# Patient Record
Sex: Male | Born: 1940 | Race: White | Hispanic: No | Marital: Single | Smoking: Former smoker
Health system: Southern US, Community
[De-identification: ages and names within clinical notes are randomized; demographics above are authoritative.]

## PROBLEM LIST (undated history)

## (undated) DIAGNOSIS — Z720 Tobacco use: Secondary | ICD-10-CM

---

## 1998-06-19 ENCOUNTER — Ambulatory Visit (HOSPITAL_COMMUNITY): Admission: RE | Admit: 1998-06-19 | Discharge: 1998-06-19 | Payer: Self-pay | Admitting: Internal Medicine

## 1998-06-19 ENCOUNTER — Encounter: Payer: Self-pay | Admitting: Internal Medicine

## 2001-10-15 ENCOUNTER — Encounter: Payer: Self-pay | Admitting: Internal Medicine

## 2001-10-15 ENCOUNTER — Encounter: Admission: RE | Admit: 2001-10-15 | Discharge: 2001-10-15 | Payer: Self-pay | Admitting: Internal Medicine

## 2006-04-24 ENCOUNTER — Encounter: Admission: RE | Admit: 2006-04-24 | Discharge: 2006-04-24 | Payer: Self-pay | Admitting: Internal Medicine

## 2007-07-13 IMAGING — CR DG CHEST 2V
2 series · 2 of 2 positions shown · non-contrast
Comparison: none

CLINICAL DATA: Cough, congestion, short of breath.  Smoking history.
CHEST - TWO VIEWS:
Two views of the chest show the lungs to be clear and slightly hyperaerated.  Mild peribronchial thickening is noted consistent with bronchitic change.  The heart is within normal limits in size.  No bony abnormality is seen.

[view not recorded (1 of 2)]
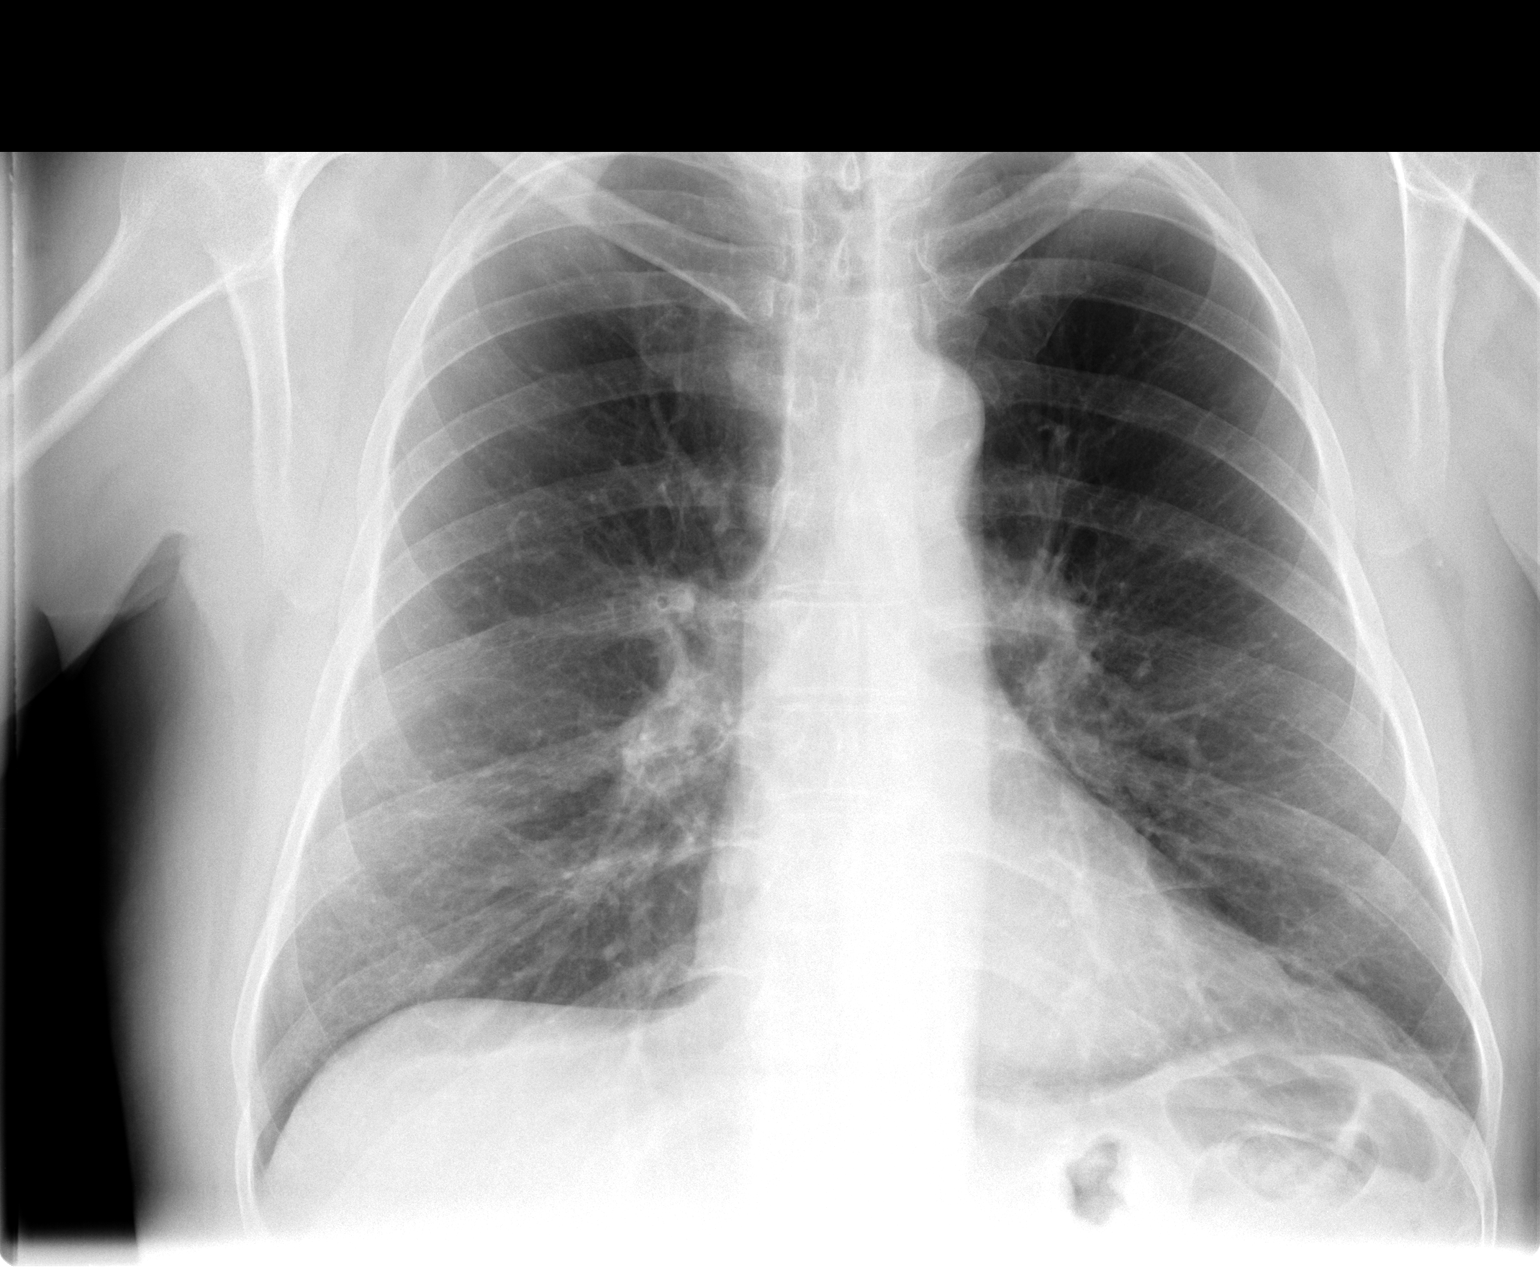

[view not recorded (2 of 2)]
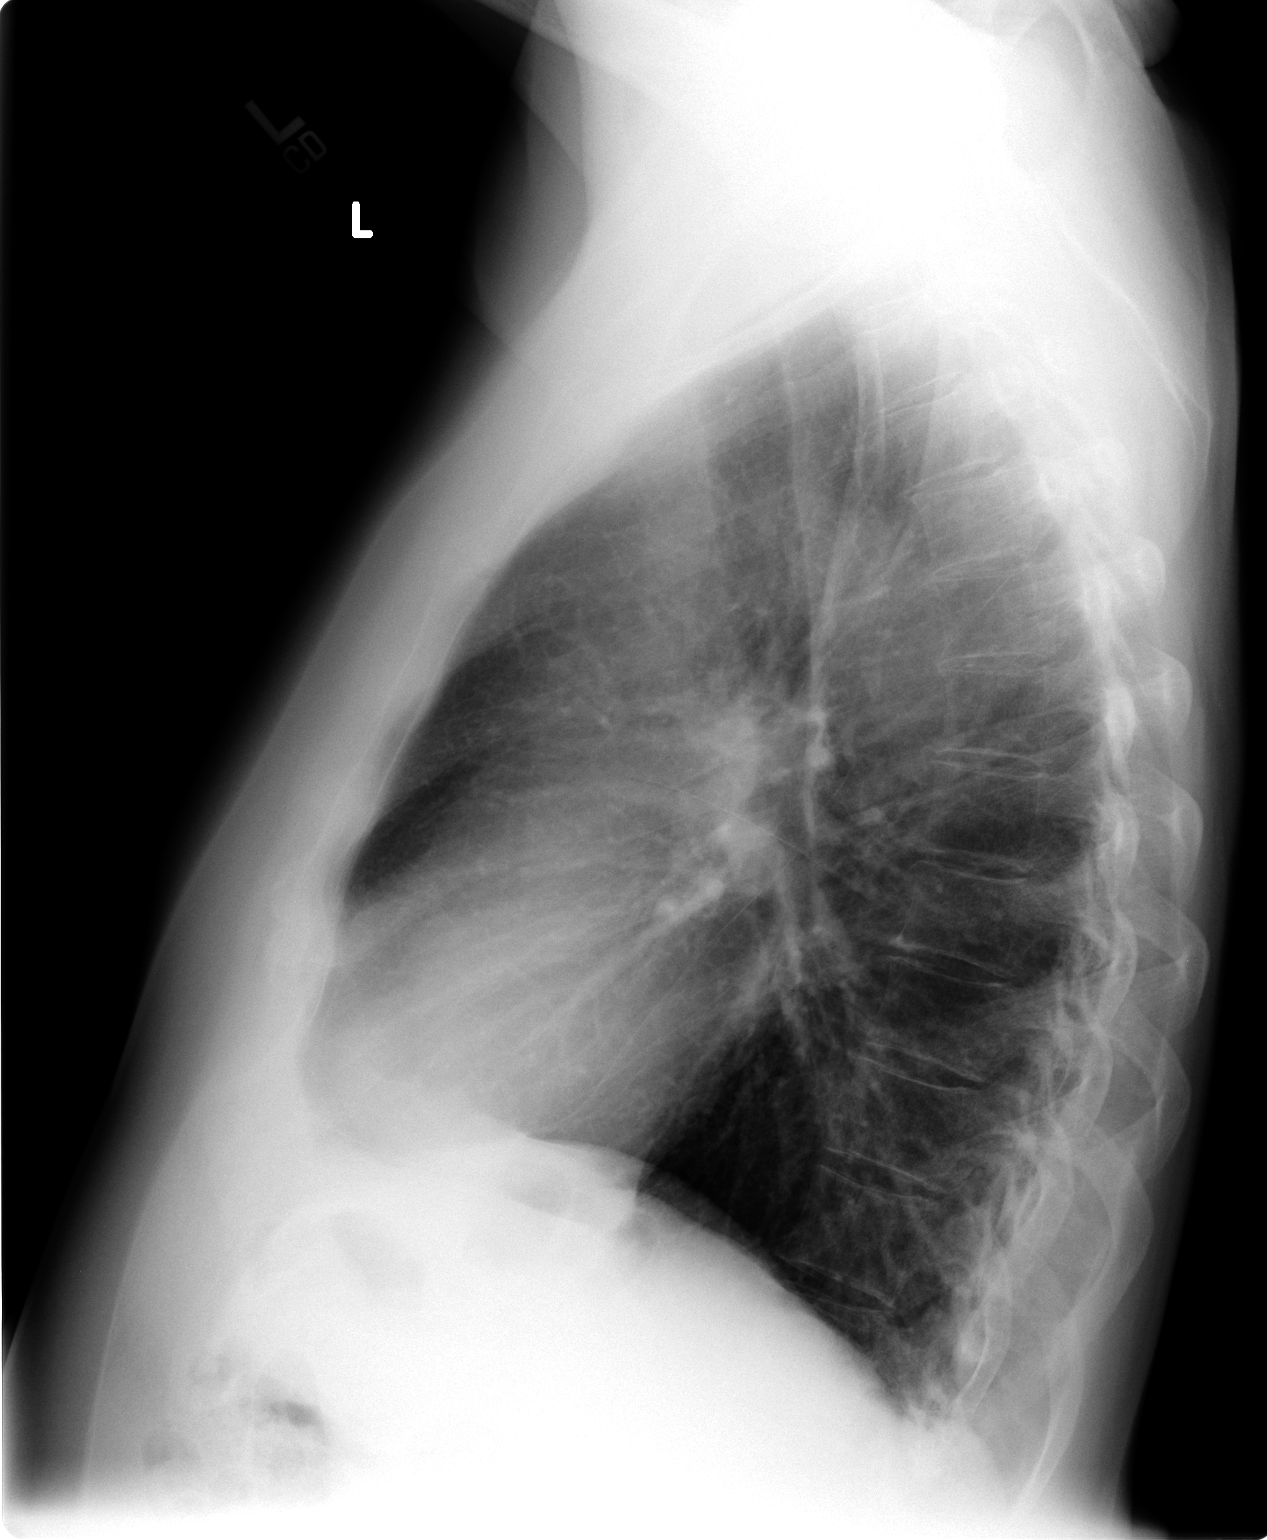

[2 of 2 positions shown; findings below may reference images not displayed]

IMPRESSION: Hyperaeration and bronchitic change.  No active infiltrate or effusion.

## 2021-09-10 ENCOUNTER — Other Ambulatory Visit: Payer: Self-pay

## 2021-09-10 ENCOUNTER — Inpatient Hospital Stay
Admission: EM | Admit: 2021-09-10 | Discharge: 2021-09-11 | DRG: 951 | Disposition: A | Discharge status: Hospice | Payer: Medicare PPO | Source: Skilled Nursing Facility | Attending: Internal Medicine | Admitting: Internal Medicine

## 2021-09-10 DIAGNOSIS — Z87891 Personal history of nicotine dependence: Secondary | ICD-10-CM | POA: Diagnosis not present

## 2021-09-10 DIAGNOSIS — Z20822 Contact with and (suspected) exposure to covid-19: Secondary | ICD-10-CM | POA: Diagnosis present

## 2021-09-10 DIAGNOSIS — Z515 Encounter for palliative care: Secondary | ICD-10-CM | POA: Diagnosis not present

## 2021-09-10 DIAGNOSIS — W19XXXA Unspecified fall, initial encounter: Secondary | ICD-10-CM | POA: Diagnosis present

## 2021-09-10 DIAGNOSIS — G4700 Insomnia, unspecified: Secondary | ICD-10-CM | POA: Diagnosis present

## 2021-09-10 DIAGNOSIS — Z5320 Procedure and treatment not carried out because of patient's decision for unspecified reasons: Secondary | ICD-10-CM | POA: Diagnosis not present

## 2021-09-10 DIAGNOSIS — W06XXXA Fall from bed, initial encounter: Secondary | ICD-10-CM | POA: Diagnosis not present

## 2021-09-10 DIAGNOSIS — Z28310 Unvaccinated for covid-19: Secondary | ICD-10-CM

## 2021-09-10 DIAGNOSIS — N184 Chronic kidney disease, stage 4 (severe): Secondary | ICD-10-CM | POA: Diagnosis present

## 2021-09-10 DIAGNOSIS — Y92230 Patient room in hospital as the place of occurrence of the external cause: Secondary | ICD-10-CM | POA: Diagnosis present

## 2021-09-10 DIAGNOSIS — R339 Retention of urine, unspecified: Secondary | ICD-10-CM | POA: Diagnosis present

## 2021-09-10 DIAGNOSIS — N3289 Other specified disorders of bladder: Secondary | ICD-10-CM | POA: Diagnosis present

## 2021-09-10 DIAGNOSIS — R627 Adult failure to thrive: Secondary | ICD-10-CM | POA: Diagnosis present

## 2021-09-10 DIAGNOSIS — R338 Other retention of urine: Secondary | ICD-10-CM | POA: Diagnosis present

## 2021-09-10 DIAGNOSIS — N179 Acute kidney failure, unspecified: Secondary | ICD-10-CM | POA: Diagnosis present

## 2021-09-10 DIAGNOSIS — Z66 Do not resuscitate: Secondary | ICD-10-CM | POA: Diagnosis present

## 2021-09-10 HISTORY — DX: Tobacco use: Z72.0

## 2021-09-10 LAB — URINALYSIS, ROUTINE W REFLEX MICROSCOPIC
Bilirubin Urine: NEGATIVE
Glucose, UA: 50 mg/dL — AB
Ketones, ur: NEGATIVE mg/dL
Leukocytes,Ua: NEGATIVE
Nitrite: NEGATIVE
Protein, ur: 100 mg/dL — AB
RBC / HPF: >50 RBC/hpf — ABNORMAL HIGH (ref 0–5)
Specific Gravity, Urine: 1.01 (ref 1.005–1.030)
Squamous Epithelial / HPF: NONE SEEN (ref 0–5)
pH: 8 (ref 5.0–8.0)

## 2021-09-10 LAB — CBC
HCT: 36.8 % — ABNORMAL LOW (ref 39.0–52.0)
Hemoglobin: 12 g/dL — ABNORMAL LOW (ref 13.0–17.0)
MCH: 28.9 pg (ref 26.0–34.0)
MCHC: 32.6 g/dL (ref 30.0–36.0)
MCV: 88.7 fL (ref 80.0–100.0)
Platelets: 226 10*3/uL (ref 150–400)
RBC: 4.15 MIL/uL — ABNORMAL LOW (ref 4.22–5.81)
RDW: 15.5 % (ref 11.5–15.5)
WBC: 8.4 10*3/uL (ref 4.0–10.5)
nRBC: 0 % (ref 0.0–0.2)

## 2021-09-10 LAB — BASIC METABOLIC PANEL
Anion gap: 15 (ref 5–15)
BUN: 80 mg/dL — ABNORMAL HIGH (ref 8–23)
CO2: 21 mmol/L — ABNORMAL LOW (ref 22–32)
Calcium: 8 mg/dL — ABNORMAL LOW (ref 8.9–10.3)
Chloride: 105 mmol/L (ref 98–111)
Creatinine, Ser: 4.33 mg/dL — ABNORMAL HIGH (ref 0.61–1.24)
GFR, Estimated: 13 mL/min — ABNORMAL LOW (ref >60)
Glucose, Bld: 113 mg/dL — ABNORMAL HIGH (ref 70–99)
Potassium: 3.4 mmol/L — ABNORMAL LOW (ref 3.5–5.1)
Sodium: 141 mmol/L (ref 135–145)

## 2021-09-10 LAB — RESP PANEL BY RT-PCR (FLU A&B, COVID) ARPGX2
Influenza A by PCR: NEGATIVE
Influenza B by PCR: NEGATIVE
SARS Coronavirus 2 by RT PCR: NEGATIVE

## 2021-09-10 MED ORDER — ONDANSETRON HCL 4 MG PO TABS: 4.0000 mg | ORAL_TABLET | Freq: Four times a day (QID) | ORAL | Status: DC | PRN

## 2021-09-10 MED ORDER — TEMAZEPAM 15 MG PO CAPS
15.0000 mg | ORAL_CAPSULE | Freq: Every day | ORAL | Status: DC
  Administered 2021-09-11: 15 mg via ORAL
  Filled 2021-09-10: qty 1

## 2021-09-10 MED ORDER — ACETAMINOPHEN 325 MG PO TABS: 650.0000 mg | ORAL_TABLET | ORAL | Status: DC | PRN

## 2021-09-10 MED ORDER — CEFTRIAXONE SODIUM 2 G IJ SOLR
2.0000 g | Freq: Once | INTRAMUSCULAR | Status: DC
Start: 2021-09-10 — End: 2021-09-11
  Filled 2021-09-10: qty 20

## 2021-09-10 MED ORDER — ACETAMINOPHEN 325 MG RE SUPP: 650.0000 mg | Freq: Four times a day (QID) | RECTAL | Status: DC | PRN

## 2021-09-10 MED ORDER — LIDOCAINE HCL URETHRAL/MUCOSAL 2 % EX GEL
1.0000 | Freq: Once | CUTANEOUS | Status: AC
Start: 2021-09-10 — End: 2021-09-10
  Administered 2021-09-10: 1 via URETHRAL
  Filled 2021-09-10: qty 10

## 2021-09-10 MED ORDER — ONDANSETRON HCL 4 MG/2ML IJ SOLN: 4.0000 mg | Freq: Four times a day (QID) | INTRAMUSCULAR | Status: DC | PRN

## 2021-09-10 MED ORDER — CEPHALEXIN 500 MG PO CAPS: 500.0000 mg | ORAL_CAPSULE | Freq: Two times a day (BID) | ORAL | Status: DC

## 2021-09-10 MED ORDER — SODIUM CHLORIDE 0.9 % IV BOLUS: 1000.0000 mL | Freq: Once | INTRAVENOUS | Status: DC

## 2021-09-10 MED ORDER — ACETAMINOPHEN 325 MG PO TABS: 650.0000 mg | ORAL_TABLET | Freq: Four times a day (QID) | ORAL | Status: DC | PRN

## 2021-09-10 NOTE — ED Notes (Signed)
Bladder scan performed, >723ml observed on scan. Dr. Ellender Hose as well as Urology notified and aware. ?

## 2021-09-10 NOTE — Progress Notes (Signed)
? ?  UROLOGY NOTE ? ?I was consulted by Dr. Ellender Hose for Foley catheter placement, after RN and MD unable to place Foley. ? ?81 year old male who was reportedly recently hospitalized at [facility] records available) and a Foley catheter was placed at that time.  This apparently was traumatically accidentally removed at his nursing facility last week, and he has had difficulty voiding and gross hematuria since then.  He was brought to the ER by his daughter earlier today, but she is no longer here.  He is extremely frail and they are reportedly moving towards hospice care.  Bladder scan greater than 750 mL, bladder easily palpable on exam.  Creatinine elevated at 4.33, he was able to give some urine earlier today with greater than 50 RBCs, rare bacteria 20-50 WBCs. ? ?I assessed the patient at bedside this evening, and recommended Foley catheter placement in the setting of his significantly elevated bladder scan and renal failure, as well as blood at the meatus on exam.  He adamantly refused any further Foley attempts.  We discussed risks including renal failure, sepsis, bladder rupture, and death.  ? ?I returned to the room with Dr. Ellender Hose, and we both encouraged him to consider Foley catheter placement, and the patient again refused. ? ?We will reassess in a.m. and re-offer a Foley catheter attempt ? ?Gabriel Madrid, MD ?09/10/2021 ? ? ? ? ? ? ? ? ?

## 2021-09-10 NOTE — ED Provider Notes (Signed)
? ?[facility] ?Provider Note ? ? ? Event Date/Time  ? First MD Initiated Contact with Patient 09/10/21 1818   ?  (approximate) ? ? ?History  ? ?Foley Catheter Placement and Failure To Thrive ? ? ?HPI ? ?Gabriel Santiago is a 81 y.o. male with past medical history of CKD after recent admission for what sounds to be urinary retention, recently gone from living at home to a skilled nursing facility within the last month, here with bleeding from his urethra.  The patient reportedly had a Foley catheter placed during his recent admission at [facility].  He left with one in place.  He reportedly tried to walk very quickly from his bed at the skilled nursing facility last week, causing it to pull out with the balloon.  There was a moderate mount of bleeding.  They have since tried to replace the Foley with return of blood.  Since then, patient has been complaining of some increasing lower abdominal pain.  He has not been eating and drinking but this has been an ongoing issue.  Family is interested in potentially discussing hospice at home.  He got more uncomfortable today so they decided to bring him to the ER for further evaluation.  He currently denies any complaints to me. ?  ? ? ?Physical Exam  ? ?Triage Vital Signs: ?ED Triage Vitals [09/10/21 1654]  ?Enc Vitals Group  ?   BP (!) 151/90  ?   Pulse Rate 88  ?   Resp 17  ?   Temp (!) 97.5 ?F (36.4 ?C)  ?   Temp Source Oral  ?   SpO2 100 %  ?   Weight 100 lb (45.4 kg)  ?   Height 5\' 8"  (1.727 m)  ?   Head Circumference   ?   Peak Flow   ?   Pain Score   ?   Pain Loc   ?   Pain Edu?   ?   Excl. in [place]?   ? ? ?Most recent vital signs: ?Vitals:  ? 09/10/21 2300 09/10/21 2330  ?BP: (!) 143/83 (!) 144/88  ?Pulse: 82 85  ?Resp: 16   ?Temp:    ?SpO2: 100% 100%  ? ? ? ?General: Awake, no distress.  Cachectic. ?CV:  Good peripheral perfusion.  ?Resp:  Normal effort.  ?Abd:  No distention.  Firm, palpable bladder distention in the lower  abdomen. ?Other:  Small amount of blood from the urethral meatus ? ? ?ED Results / Procedures / Treatments  ? ?Labs ?(all labs ordered are listed, but only abnormal results are displayed) ?Labs Reviewed  ?BASIC METABOLIC PANEL - Abnormal; Notable for the following components:  ?    Result Value  ? Potassium 3.4 (*)   ? CO2 21 (*)   ? Glucose, Bld 113 (*)   ? BUN 80 (*)   ? Creatinine, Ser 4.33 (*)   ? Calcium 8.0 (*)   ? GFR, Estimated 13 (*)   ? All other components within normal limits  ?CBC - Abnormal; Notable for the following components:  ? RBC 4.15 (*)   ? Hemoglobin 12.0 (*)   ? HCT 36.8 (*)   ? All other components within normal limits  ?URINALYSIS, ROUTINE W REFLEX MICROSCOPIC - Abnormal; Notable for the following components:  ? Color, Urine RED (*)   ? APPearance CLOUDY (*)   ? Glucose, UA 50 (*)   ? Hgb urine dipstick LARGE (*)   ?  Protein, ur 100 (*)   ? RBC / HPF >50 (*)   ? Bacteria, UA RARE (*)   ? All other components within normal limits  ?RESP PANEL BY RT-PCR (FLU A&B, COVID) ARPGX2  ?BASIC METABOLIC PANEL  ?CBC  ?CBG MONITORING, ED  ? ? ? ?EKG ? ? ? ?RADIOLOGY ? ? ? ?I also independently reviewed and agree with radiologist interpretations. ? ? ?PROCEDURES: ? ?Critical Care performed: No ? ?BLADDER CATHETERIZATION ? ?Date/Time: 09/10/2021 11:45 PM ?Performed by: Duffy Bruce, MD ?Authorized by: Duffy Bruce, MD  ? ?Consent:  ?  Consent obtained:  Verbal ?  Consent given by:  Parent ?  Risks, benefits, and alternatives were discussed: yes   ?  Risks discussed:  False passage, incomplete procedure, urethral injury, infection and pain ?  Alternatives discussed:  Referral ?Anesthesia:  ?  Anesthesia method:  Topical application ?  Topical anesthetic:  Lidocaine gel ?Procedure details:  ?  Provider performed due to:  Complicated insertion ?  Catheter insertion:  Indwelling ?  Catheter size:  18 Fr ?  Number of attempts:  2 ?  Urine characteristics:  Bloody ?Post-procedure details:  ?  Procedure  completion:  Tolerated well, no immediate complications ? ? ? ?MEDICATIONS ORDERED IN ED: ?Medications  ?sodium chloride 0.9 % bolus 1,000 mL (0 mLs Intravenous Hold 09/10/21 2032)  ?cefTRIAXone (ROCEPHIN) 2 g in sodium chloride 0.9 % 100 mL IVPB (0 g Intravenous Hold 09/10/21 2000)  ?temazepam (RESTORIL) capsule 15 mg (has no administration in time range)  ?cephALEXin (KEFLEX) capsule 500 mg (has no administration in time range)  ?acetaminophen (TYLENOL) tablet 650 mg (has no administration in time range)  ?  Or  ?acetaminophen (TYLENOL) suppository 650 mg (has no administration in time range)  ?ondansetron (ZOFRAN) tablet 4 mg (has no administration in time range)  ?  Or  ?ondansetron (ZOFRAN) injection 4 mg (has no administration in time range)  ?lidocaine (XYLOCAINE) 2 % jelly 1 application. (1 application. Urethral Given 09/10/21 2000)  ? ? ? ?IMPRESSION / MDM / ASSESSMENT AND PLAN / ED COURSE  ?I reviewed the triage vital signs and the nursing notes. ?             ?               ? ? ?The patient is on the cardiac monitor to evaluate for evidence of arrhythmia and/or significant heart rate changes. ? ? ?MDM: 81 year old male with past medical history of what sounds like significant recent functional decline and weight loss, including recent admission to [facility] for acute urinary retention with renal failure.  Patient has recently gone to a skilled nursing facility, and reportedly took out his Foley catheter a week ago.  Here, the patient does have significant bladder distention.  He does not appear uncomfortable, however.  Discussed plan of care with the patient's daughter.  She would not like IV placement, or lab work.  However, she would like Foley catheter placement for comfort.  Patient is in agreement at this time.  Attempted to place Foley catheter, but unable to do so due to what appears to be prostate enlargement.  Patient had a small amount of blood at the urethral meatus.  No active bleeding  during placement or after.  I called urology, who came to the bedside.  Upon arrival of urology, patient no longer would like Foley catheter placement.  He is awake and alert.  He understands that without this, he could  have increasing pain as well as worsening renal failure.  Based on my discussion with him as well as patient's daughter, he would like to move towards hospice care, ideally at home.  We will plan to admit for transition of care team consultation and hospice evaluation.  I have told him to let us know if he changes mind regarding the catheter.  Called daughter to update. ? ? ? ?MEDICATIONS GIVEN IN ED: ?Medications  ?sodium chloride 0.9 % bolus 1,000 mL (0 mLs Intravenous Hold 09/10/21 2032)  ?cefTRIAXone (ROCEPHIN) 2 g in sodium chloride 0.9 % 100 mL IVPB (0 g Intravenous Hold 09/10/21 2000)  ?temazepam (RESTORIL) capsule 15 mg (has no administration in time range)  ?cephALEXin (KEFLEX) capsule 500 mg (has no administration in time range)  ?acetaminophen (TYLENOL) tablet 650 mg (has no administration in time range)  ?  Or  ?acetaminophen (TYLENOL) suppository 650 mg (has no administration in time range)  ?ondansetron (ZOFRAN) tablet 4 mg (has no administration in time range)  ?  Or  ?ondansetron (ZOFRAN) injection 4 mg (has no administration in time range)  ?lidocaine (XYLOCAINE) 2 % jelly 1 application. (1 application. Urethral Given 09/10/21 2000)  ? ? ? ?Consults:  ?Urology Dr. Diamantina Providence ? ? ?EMR reviewed  ? ? ? ? ? ?FINAL CLINICAL IMPRESSION(S) / ED DIAGNOSES  ? ?Final diagnoses:  ?Urinary retention  ? ? ? ?Rx / DC Orders  ? ?ED Discharge Orders   ? ? None  ? ?  ? ? ? ?Note:  This document was prepared using Dragon voice recognition software and may include unintentional dictation errors. ?  ?Duffy Bruce, MD ?09/10/21 2348 ? ?

## 2021-09-10 NOTE — ED Notes (Signed)
Urology cart at the bedside, Uro[employer] given to Dr. Ellender Hose ?

## 2021-09-10 NOTE — ED Notes (Signed)
Report received from Tom, RN.

## 2021-09-10 NOTE — Assessment & Plan Note (Signed)
-   Declined Foley catheter with ED provider and with urologist at bedside ?

## 2021-09-10 NOTE — ED Notes (Signed)
Dr. Ellender Hose attempted once to place a coude catheter without success and a second time with a silicone catheter without success. A call in to Urology is made. Pt tolerated procedure well. ?

## 2021-09-10 NOTE — ED Notes (Signed)
Secretary placed a request to equipment dept for Urology Cart. ?

## 2021-09-10 NOTE — ED Triage Notes (Signed)
Pt comes into the Ed via EMS from [facility], daughter who was there today, states the pt got up to go to the BR while attached to a foley catheter and it was ripped out, states he has been passing blood and today they attempted to reinsert the catheter without success. Pt has a hx of dementia and family concerns about FFT ? ?154/93 ?100%RA ?HR85 ?98.2 temp ?CBG 108 ?

## 2021-09-10 NOTE — ED Provider Triage Note (Signed)
Emergency Medicine Provider Triage Evaluation Note ? ?Prudy Feeler, a 81 y.o. male  was evaluated in triage.  Pt complains of urinary retention, hematuria, and CKD.  Patient presents with his adult daughter from [facility] after she found him to be unkept and unattended to in his room. ? ?Review of Systems  ?Positive: Dysuria, hematuria ?Negative: NVD ? ?Physical Exam  ?There were no vitals taken for this visit. ?Gen:   Awake, no distress  frail, cachetic ?Resp:  Normal effort CTA ?MSK:   Moves extremities without difficulty  ?Other:   ? ?Medical Decision Making  ?Medically screening exam initiated at 3:29 PM.  Appropriate orders placed.  TREVONNE NYLAND was informed that the remainder of the evaluation will be completed by another provider, this initial triage assessment does not replace that evaluation, and the importance of remaining in the ED until their evaluation is complete. ? ?Geriatric patient to the ED with family for concerns for possible urinary retention and CKD.  Patient family is also requesting help with hospice management and home health care. ?  ?Melvenia Needles, PA-C ?09/10/21 1533 ? ?

## 2021-09-11 ENCOUNTER — Encounter: Payer: Self-pay | Admitting: Internal Medicine

## 2021-09-11 DIAGNOSIS — Z515 Encounter for palliative care: Secondary | ICD-10-CM | POA: Diagnosis not present

## 2021-09-11 DIAGNOSIS — N184 Chronic kidney disease, stage 4 (severe): Secondary | ICD-10-CM | POA: Diagnosis present

## 2021-09-11 DIAGNOSIS — W19XXXA Unspecified fall, initial encounter: Secondary | ICD-10-CM | POA: Diagnosis present

## 2021-09-11 MED ORDER — MORPHINE SULFATE (CONCENTRATE) 10 MG/0.5ML PO SOLN: 5.0000 mg | ORAL | 0 refills | Status: AC | PRN

## 2021-09-11 MED ORDER — MORPHINE SULFATE (CONCENTRATE) 10 MG/0.5ML PO SOLN: 5.0000 mg | ORAL | Status: DC | PRN

## 2021-09-11 MED ORDER — HALOPERIDOL LACTATE 5 MG/ML IJ SOLN: 0.5000 mg | INTRAMUSCULAR | Status: DC | PRN

## 2021-09-11 MED ORDER — ONDANSETRON 4 MG PO TBDP
4.0000 mg | ORAL_TABLET | Freq: Four times a day (QID) | ORAL | 0 refills | Status: AC | PRN
Start: 2021-09-11 — End: ?

## 2021-09-11 MED ORDER — GLYCOPYRROLATE 1 MG PO TABS
1.0000 mg | ORAL_TABLET | ORAL | Status: DC | PRN
  Filled 2021-09-11: qty 1

## 2021-09-11 MED ORDER — ACETAMINOPHEN 325 MG PO TABS: 650.0000 mg | ORAL_TABLET | Freq: Four times a day (QID) | ORAL | Status: AC | PRN

## 2021-09-11 MED ORDER — HALOPERIDOL 0.5 MG PO TABS
0.5000 mg | ORAL_TABLET | ORAL | Status: AC | PRN
Start: 2021-09-11 — End: ?

## 2021-09-11 MED ORDER — ONDANSETRON 4 MG PO TBDP
4.0000 mg | ORAL_TABLET | Freq: Four times a day (QID) | ORAL | Status: DC | PRN
Start: 2021-09-11 — End: 2021-09-11
  Administered 2021-09-11: 4 mg via ORAL
  Filled 2021-09-11: qty 1

## 2021-09-11 MED ORDER — HALOPERIDOL LACTATE 2 MG/ML PO CONC
0.5000 mg | ORAL | Status: DC | PRN
  Filled 2021-09-11: qty 5

## 2021-09-11 MED ORDER — GLYCOPYRROLATE 0.2 MG/ML IJ SOLN
0.2000 mg | INTRAMUSCULAR | Status: DC | PRN
  Filled 2021-09-11: qty 1

## 2021-09-11 MED ORDER — HALOPERIDOL 0.5 MG PO TABS
0.5000 mg | ORAL_TABLET | ORAL | Status: DC | PRN
  Filled 2021-09-11: qty 1

## 2021-09-11 MED ORDER — LORAZEPAM 2 MG/ML IJ SOLN: 1.0000 mg | INTRAMUSCULAR | Status: DC | PRN

## 2021-09-11 MED ORDER — BIOTENE DRY MOUTH MT LIQD
15.0000 mL | OROMUCOSAL | Status: DC | PRN
  Filled 2021-09-11: qty 15

## 2021-09-11 MED ORDER — LORAZEPAM 1 MG PO TABS: 1.0000 mg | ORAL_TABLET | ORAL | Status: DC | PRN

## 2021-09-11 MED ORDER — ACETAMINOPHEN 325 MG RE SUPP: 650.0000 mg | Freq: Four times a day (QID) | RECTAL | Status: DC | PRN

## 2021-09-11 MED ORDER — ACETAMINOPHEN 325 MG PO TABS: 650.0000 mg | ORAL_TABLET | Freq: Four times a day (QID) | ORAL | Status: DC | PRN

## 2021-09-11 MED ORDER — MORPHINE SULFATE (PF) 2 MG/ML IV SOLN: 1.0000 mg | INTRAVENOUS | Status: DC | PRN

## 2021-09-11 MED ORDER — LORAZEPAM 1 MG PO TABS
1.0000 mg | ORAL_TABLET | ORAL | 0 refills | Status: AC | PRN
Start: 2021-09-11 — End: ?

## 2021-09-11 MED ORDER — LORAZEPAM 2 MG/ML PO CONC: 1.0000 mg | ORAL | Status: DC | PRN

## 2021-09-11 MED ORDER — BIOTENE DRY MOUTH MT LIQD: 15.0000 mL | OROMUCOSAL | Status: AC | PRN

## 2021-09-11 MED ORDER — POLYVINYL ALCOHOL 1.4 % OP SOLN: 1.0000 [drp] | Freq: Four times a day (QID) | OPHTHALMIC | Status: DC | PRN

## 2021-09-11 MED ORDER — ONDANSETRON HCL 4 MG/2ML IJ SOLN
4.0000 mg | Freq: Four times a day (QID) | INTRAMUSCULAR | Status: DC | PRN
  Filled 2021-09-11: qty 2

## 2021-09-11 MED ORDER — GLYCOPYRROLATE 1 MG PO TABS
1.0000 mg | ORAL_TABLET | ORAL | Status: AC | PRN
Start: 2021-09-11 — End: ?

## 2021-09-11 MED ORDER — POLYVINYL ALCOHOL 1.4 % OP SOLN: 1.0000 [drp] | Freq: Four times a day (QID) | OPHTHALMIC | 0 refills | Status: AC | PRN

## 2021-09-11 NOTE — Assessment & Plan Note (Addendum)
-   Patient presenting with apparent failure to thrive with acute kidney injury presumed secondary to urinary retention ?- After discussion in the emergency medicine provider, family has decided to proceed with comfort care only ?- Comfort care order set utilized ?- No antibiotics or IVF as per family's request ?- As needed medications include pain control with morphine as needed, would transition to a drip if needed but patient does not appear to be in pain at this time ?- TOC consulted for outpatient versus inpatient hospice and to assist family with getting hospital bed and supplies etc. ?- RN can pronounce death ?

## 2021-09-11 NOTE — Progress Notes (Signed)
Report give to [facility]. Medical Necessity completed.  ?

## 2021-09-11 NOTE — Progress Notes (Addendum)
Manufacturing engineer [facility]) Hospital Liaison Note ? ?Received request from Transitions of Care Manager  Maryan Puls., RN for family interest in [facility]. Visited patient at bedside and spoke with daughter/Amanda to confirm interest and explain services. ? ?Approval for Hospice Home is determined by [facility] MD. Once [facility] MD has determined Hospice Home eligibility, [facility] will update hospital staff and family. Eligibility is approved. ? ?Addendum: ?Consent forms have been completed. ? ?EMS notified of patient D/C and transport arranged for 5p pickup, TOC/Jeanna and Attending Physician/Dr. Arbutus Ped also notified of transport arrangement.  ?  ?Please send signed DNR form with patient and RN call report to [phone].  ?  ?Daphene Calamity, MSW ?[facility] Liaison ?[phone] ? ? ?Please do not hesitate to call with any hospice related questions.  ?  ?Thank you for the opportunity to participate in this patient's care. ? ?Daphene Calamity, MSW ?[facility]  ?[phone] ? ?

## 2021-09-11 NOTE — Discharge Summary (Signed)
?Physician Discharge Summary ?  ?Patient: Gabriel Santiago MRN: [id number] DOB: 1940-06-22  ?Admit date:     09/10/2021  ?Discharge date: 09/11/21  ?Discharge Physician: Ezekiel Slocumb  ? ?PCP: Pcp, No  ? ?Recommendations at discharge:  ? ? Follow up with hospice providers ? ?Discharge Diagnoses: ?Principal Problem: ?  End of life care ?Active Problems: ?  Failure to thrive in adult ?  Acute urinary retention ?  CKD (chronic kidney disease) stage 4, GFR 15-29 ml/min (HCC) ?  Fall ? ? ?Hospital Course: ?Mr. Gabriel Santiago is a CKD 4, failure to thrive, urinary retention, insomnia, who presents emergency department for chief concerns of failure to thrive. ? ?Per ED report, Mr. Gabriel Santiago was living at home by himself until 1 month ago.  In 1 month time, he lost his appetite and his weight down to skin and bones.  He became increasingly weak and refused ED visit.  Ultimately he consented and was brought to [facility] he had acute kidney injury from urinary retention.  His serum creatinine improved after Foley was placed at [facility] and then he was discharged to SNF facility.  At the SNF, he ripped out his Foley about a week ago and refused to come to the ED. ? ?Ultimately he consented to come to the ED and in the emergency department his daughter, Gabriel Santiago refused labs and further treatment stating that she would like her father to be comfortable. ? ?Patient was noted to have bleeding from the meatus and Foley was attempted however unsuccessful in the emergency department. ?Urology was consulted, patient refused Foley placement with urology.  Both urologist and ED provider discussed with patient at bedside that if he did not get a Foley he will develop kidney failure and he continued to refuse. ? ?At this time family had elected to have patient be on comfort care. ? ?ED treatment: Lidocaine jelly application, ceftriaxone 2 g IV one-time dose, sodium chloride 1 L bolus.   Ceftriaxone and sodium chloride 1 L bolus were not given at the request of daughter. ? ? ?I assumed care of patient this AM, 5/9.   ?Patient has been accepted at residential hospice facility and is medically stable for discharge and transport there today. ? ? ? ? ?Assessment and Plan:  (Taken from H&P by Dr. Tobie Poet) ?* End of life care ?- Patient presenting with apparent failure to thrive with acute kidney injury presumed secondary to urinary retention ?- After discussion in the emergency medicine provider, family has decided to proceed with comfort care only ?- Comfort care order set utilized ?- No antibiotics or IVF as per family's request ?- As needed medications include pain control with morphine as needed, would transition to a drip if needed but patient does not appear to be in pain at this time ?- TOC consulted for outpatient versus inpatient hospice and to assist family with getting hospital bed and supplies etc. ?- RN can pronounce death ? ?Fall ?- In the emergency department ?- Neurologically intact, low clinical suspicion for intracranial hemorrhage ?- I discussed this with daughter, Ms. Gabriel Santiago and I have offered a CT of the head to evaluate for possible bleed and Ms. Gabriel Santiago has declined stating that she just wants him to be comfortable when left alone is much as possible ? ?CKD (chronic kidney disease) stage 4, GFR 15-29 ml/min (HCC) ?- Baseline unknown ?- Serum creatinine on presentation is 4.33/GFR 30 ? ?Acute urinary retention ?- Declined  Foley catheter with ED provider and with urologist at bedside ? ? ? ? ?  ? ? ?Consultants: Hospice ?Procedures performed: none  ?Disposition: Hospice care ?Diet recommendation:  ?Discharge Diet Orders (From admission, onward)  ? ?  Start     Ordered  ? 09/11/21 0000  Diet - low sodium heart healthy       ? 09/11/21 1252  ? ?  ?  ? ?  ? ?Regular diet ?DISCHARGE MEDICATION: ?Allergies as of 09/11/2021   ?No Known Allergies ?  ? ?  ?Medication List  ?  ? ?TAKE these  medications   ? ?acetaminophen 325 MG tablet ?Commonly known as: TYLENOL ?Take 2 tablets (650 mg total) by mouth every 6 (six) hours as needed for mild pain (or Fever >/= 101). ?  ?antiseptic oral rinse Liqd ?Apply 15 mLs topically as needed for dry mouth. ?  ?glycopyrrolate 1 MG tablet ?Commonly known as: ROBINUL ?Take 1 tablet (1 mg total) by mouth every 4 (four) hours as needed (excessive secretions). ?  ?haloperidol 0.5 MG tablet ?Commonly known as: HALDOL ?Take 1 tablet (0.5 mg total) by mouth every 4 (four) hours as needed for agitation (or delirium). ?  ?LORazepam 1 MG tablet ?Commonly known as: ATIVAN ?Take 1 tablet (1 mg total) by mouth every 4 (four) hours as needed for anxiety. ?  ?morphine CONCENTRATE 10 MG/0.5ML Soln concentrated solution ?Take 0.25 mLs (5 mg total) by mouth every 2 (two) hours as needed for moderate pain (or dyspnea). ?  ?ondansetron 4 MG disintegrating tablet ?Commonly known as: ZOFRAN-ODT ?Take 1 tablet (4 mg total) by mouth every 6 (six) hours as needed for nausea. ?  ?polyvinyl alcohol 1.4 % ophthalmic solution ?Commonly known as: LIQUIFILM TEARS ?Place 1 drop into both eyes 4 (four) times daily as needed for dry eyes. ?  ?temazepam 15 MG capsule ?Commonly known as: RESTORIL ?Take 15 mg by mouth at bedtime as needed for sleep. ?  ? ?  ? ? ?Discharge Exam: ?Danley Danker Weights  ? 09/10/21 1654  ?Weight: 45.4 kg  ? ?General exam: resting comfortably, cachectic, no acute distress ?Respiratory system: normal respiratory effort. Symmetric chest rise ?Cardiovascular system: warm distal extremities no pedal edema.   ?Gastrointestinal system: soft, non-distended abdomen. ?Central nervous system: no gross focal neurologic deficits ?Extremities: no edema, normal tone ?Skin: dry, intact, normal temperature ?Psychiatry: normal mood, congruent affect ? ? ?Condition at discharge: stable ? ?The results of significant diagnostics from this hospitalization (including imaging, microbiology, ancillary and  laboratory) are listed below for reference.  ? ?Imaging Studies: ?No results found. ? ?Microbiology: ?Results for orders placed or performed during the hospital encounter of 09/10/21  ?Resp Panel by RT-PCR (Flu A&B, Covid) Nasopharyngeal Swab     Status: None  ? Collection Time: 09/10/21 10:12 PM  ? Specimen: Nasopharyngeal Swab; Nasopharyngeal(NP) swabs in vial transport medium  ?Result Value Ref Range Status  ? SARS Coronavirus 2 by RT PCR NEGATIVE NEGATIVE Final  ?  Comment: (NOTE) ?SARS-CoV-2 target nucleic acids are NOT DETECTED. ? ?The SARS-CoV-2 RNA is generally detectable in upper respiratory ?specimens during the acute phase of infection. The lowest ?concentration of SARS-CoV-2 viral copies this assay can detect is ?138 copies/mL. A negative result does not preclude SARS-Cov-2 ?infection and should not be used as the sole basis for treatment or ?other patient management decisions. A negative result may occur with  ?improper specimen collection/handling, submission of specimen other ?than nasopharyngeal swab, presence of viral mutation(s) within the ?areas  targeted by this assay, and inadequate number of viral ?copies(<138 copies/mL). A negative result must be combined with ?clinical observations, patient history, and epidemiological ?information. The expected result is Negative. ? ?Fact Sheet for Patients:  ?EntrepreneurPulse.com.au ? ?Fact Sheet for Healthcare Providers:  ?IncredibleEmployment.be ? ?This test is no t yet approved or cleared by the [place] FDA and  ?has been authorized for detection and/or diagnosis of SARS-CoV-2 by ?FDA under an Emergency Use Authorization (EUA). This EUA will remain  ?in effect (meaning this test can be used) for the duration of the ?COVID-19 declaration under Section 564(b)(1) of the Act, 21 ?U.S.C.section 360bbb-3(b)(1), unless the authorization is terminated  ?or revoked sooner.  ? ? ?  ? Influenza A by PCR NEGATIVE NEGATIVE  Final  ? Influenza B by PCR NEGATIVE NEGATIVE Final  ?  Comment: (NOTE) ?The Xpert Xpress SARS-CoV-2/FLU/RSV plus assay is intended as an aid ?in the diagnosis of influenza from Nasopharyngeal swab specime

## 2021-09-11 NOTE — ED Notes (Signed)
When admitting MD walked into the room to evaluate pt, she came back out and stated that the patient was lying on the floor. Writer went into the room and assessed pt for injuries. The only injury observed was a skin tear to the left posterior elbow. Assessed orientation, pt is alert, oriented to self, location, and situation which was his baseline on arrival to ED. Pt has not been altered beyond his baseline and both siderrails have been up with call bell within reach and bed in lowest position and locked in place. Placed pt back in the bed and reattached monitors. Bed alarm on and pt instructed to not get out of the bed, to use his call bell instead. Pt apologized and verbalized understanding. Charge RN, admitting MD, and ED physician all notified and aware. ?

## 2021-09-11 NOTE — H&P (Signed)
History and Physical   Gabriel Santiago NFA:[id number] DOB: 19-Apr-1941 DOA: 09/10/2021  PCP: Pcp, No  Patient coming from: [facility], via EMS  I have personally briefly reviewed patient's old medical records in [facility] EMR.  Chief Concern: Urinary retention  HPI: Mr. Gabriel Santiago is a CKD 4, failure to thrive, urinary retention, insomnia, who presents emergency department for chief concerns of failure to thrive.  Per ED report, Mr. Gabriel Santiago was living at home by himself until 1 month ago.  In 1 month time, he lost his appetite and his weight down to skin and bones.  He became increasingly weak and refused ED visit.  Ultimately he consented and was brought to [facility].  From [facility] he had acute kidney injury from urinary retention.  His serum creatinine improved after Foley was placed at [facility] and then he was discharged to SNF facility.  At the SNF, he ripped out his Foley about a week ago and refused to come to the ED.  Ultimately he consented to come to the ED and in the emergency department his daughter, Gabriel Santiago refused labs and further treatment stating that she would like her father to be comfortable.  Patient was noted to have bleeding from the meatus and Foley was attempted however unsuccessful in the emergency department. Urology was consulted, patient refused Foley placement with urology.  Both urologist and ED provider discussed with patient at bedside that if he did not get a Foley he will develop kidney failure and he continued to refuse.  At this time family had elected to have patient be on comfort care.  ED treatment: Lidocaine jelly application, ceftriaxone 2 g IV one-time dose, sodium chloride 1 L bolus.  Ceftriaxone and sodium chloride 1 L bolus were not given at the request of daughter.  On my initial evaluation of patient, he was found laying on the floor next to the emergency medicine bed.  I asked him what he is doing on  the floor, he states that he fell trying to get out of bed to go to the bathroom.  He states he is not hurting anywhere.  I immediately called nursing staff, and nurse and I helped patient to the bed. ------ At bedside he is able to tell me his full name, age, current location and he knows the current calendar year. He states that he has been feeling very tired.  I confirmed with patient that he does not want treatment including antibiotic, fluid, Foley catheter.  I reiterated that if he does not get a Foley catheter, his kidneys will fail because he will not urinate, and this will lead to death.  He states he knows this, he states "I am tired.  I just want to be comfortable."  Per daughter: Over the last year, his health has declined and he has been loosing a lot of weight. He kept telling family that he is fine. He kept refusing family visitation and family get together.   On 09/02/21, she came to see patient at his home because she couldn't get in touch with him.  She states that she found him essentially 'he was unresponsive'.   He was taken to [facility] for 1 week and she states that they told her that he has only a few more days left because he was having kidney failure and not doing well.  At that time patient elected to have DNR DNI papers signed.  Ultimately, his kidneys improved after Foley catheter  was placed and he was discharged to [facility]  On the first night there, he ambulated and tried to get up to pee and pulled out his foley catheter.  Gabriel Santiago further states that she does not want her father to go back to [facility].  She states that she is respecting his wishes and just want him to be comfortable and at peace.   Social history: Formally lived at home by himself.  He is a former tobacco user, smoking 1 pack/day and he quit 1 year ago. Daughter states that he quit 2 months ago.  He infrequently drinks EtOH, he states that he will drink a pint per month.  He denies  history of recreational drug use.  He is retired and formerly worked as a Medical illustrator.  ROS: Constitutional: no weight change, no fever ENT/Mouth: no sore throat, no rhinorrhea Eyes: no eye pain, no vision changes Cardiovascular: no chest pain, no dyspnea,  no edema, no palpitations Respiratory: no cough, no sputum, no wheezing Gastrointestinal: no nausea, no vomiting, no diarrhea, no constipation Genitourinary: no urinary incontinence, no dysuria, no hematuria Musculoskeletal: no arthralgias, no myalgias Skin: no skin lesions, no pruritus, Neuro: + weakness, no loss of consciousness, no syncope Psych: no anxiety, no depression, + decrease appetite Heme/Lymph: no bruising, no bleeding  ED Course: Discussed with emergency medicine provider, patient requiring hospitalization for chief concerns of end-of-life care admission.  Assessment/Plan  Principal Problem:   End of life care Active Problems:   Failure to thrive in adult   Acute urinary retention   CKD (chronic kidney disease) stage 4, GFR 15-29 ml/min (HCC)   Fall   Assessment and Plan:  * End of life care - Patient presenting with apparent failure to thrive with acute kidney injury presumed secondary to urinary retention - After discussion in the emergency medicine provider, family has decided to proceed with comfort care only - Comfort care order set utilized - No antibiotics or IVF as per family's request - As needed medications include pain control with morphine as needed, would transition to a drip if needed but patient does not appear to be in pain at this time - [facility] consulted for outpatient versus inpatient hospice and to assist family with getting hospital bed and supplies etc. - RN can pronounce death  Fall - In the emergency department - Neurologically intact, low clinical suspicion for intracranial hemorrhage - I discussed this with daughter, Gabriel Santiago and I have offered a CT of the head to evaluate for  possible bleed and Gabriel Santiago has declined stating that she just wants him to be comfortable when left alone is much as possible  CKD (chronic kidney disease) stage 4, GFR 15-29 ml/min (HCC) - Baseline unknown - Serum creatinine on presentation is 4.33/GFR 30  Acute urinary retention - Declined Foley catheter with ED provider and with urologist at bedside  Chart reviewed.   DVT prophylaxis: None comfort care measures only Code Status: DNR/DNI Diet: Dysphagia 2 Family Communication: Updated and discussed extensively with daughter, Georgie Santiago over the phone Disposition Plan: Poor prognosis Consults called: TOC Admission status:  Admit - It is my clinical opinion that admission to INPATIENT is reasonable and necessary because of the expectation that this patient will require hospital care that crosses at least 2 midnights to treat this condition based on the medical complexity of the problems presented.  Given the aforementioned information, the predictability of an adverse outcome is felt to be significant.  Past Medical History:  Diagnosis Date   Tobacco use    History reviewed. No pertinent surgical history.  Social History:  reports that he has quit smoking. His smoking use included cigarettes. He has never used smokeless tobacco. He reports that he does not currently use alcohol. He reports that he does not use drugs.  No Known Allergies History reviewed. No pertinent family history. Family history: Family history reviewed and not pertinent  Prior to Admission medications   Medication Sig Start Date End Date Taking? Authorizing Provider  temazepam (RESTORIL) 15 MG capsule Take 15 mg by mouth at bedtime as needed for sleep.   Yes [provider]   Physical Exam: Vitals:   09/10/21 2300 09/10/21 2330 09/11/21 0000 09/11/21 0030  BP: (!) 143/83 (!) 144/88 118/66 134/78  Pulse: 82 85 84 77  Resp: 16  19 15   Temp:      TempSrc:      SpO2: 100% 100% 100% 99%   Weight:      Height:       Constitutional: appears frail, poorly nourished, cachectic, NAD, comfortable Eyes: PERRL, lids and conjunctivae normal ENMT: Mucous membranes are moist. Posterior pharynx clear of any exudate or lesions. Age-appropriate dentition. Hearing appropriate Neck: normal, supple, no masses, no thyromegaly Respiratory: clear to auscultation bilaterally, no wheezing, no crackles. Normal respiratory effort. No accessory muscle use.  Cardiovascular: Regular rate and rhythm, no murmurs / rubs / gallops. No extremity edema. 2+ pedal pulses. No carotid bruits.  Abdomen: Scaphoid abdomen, no tenderness, no masses palpated, no hepatosplenomegaly. Bowel sounds positive.  Musculoskeletal: no clubbing / cyanosis. No joint deformity upper and lower extremities. Good ROM, no contractures, no atrophy. Normal muscle tone.  Skin: no rashes, lesions, ulcers. No induration Neurologic: Sensation intact. Strength 5/5 in all 4.  Psychiatric: Normal judgment and insight. Alert and oriented x 3. Normal mood.   EKG: independently reviewed, showing sinus rhythm with rate of 96, QTc 487, LVH  Chest x-ray on Admission: Not indicated at this time as patient is being admitted for comfort measures/end-of-life care  Labs on Admission: I have personally reviewed following labs  CBC: Recent Labs  Lab 09/10/21 1607  WBC 8.4  HGB 12.0*  HCT 36.8*  MCV 88.7  PLT 226   Basic Metabolic Panel: Recent Labs  Lab 09/10/21 1607  NA 141  K 3.4*  CL 105  CO2 21*  GLUCOSE 113*  BUN 80*  CREATININE 4.33*  CALCIUM 8.0*   GFR: Estimated Creatinine Clearance: 8.4 mL/min (A) (by C-G formula based on SCr of 4.33 mg/dL (H)).  Urine analysis:    Component Value Date/Time   COLORURINE RED (A) 09/10/2021 1458   APPEARANCEUR CLOUDY (A) 09/10/2021 1458   LABSPEC 1.010 09/10/2021 1458   PHURINE 8.0 09/10/2021 1458   GLUCOSEU 50 (A) 09/10/2021 1458   HGBUR LARGE (A) 09/10/2021 1458   BILIRUBINUR  NEGATIVE 09/10/2021 1458   KETONESUR NEGATIVE 09/10/2021 1458   PROTEINUR 100 (A) 09/10/2021 1458   NITRITE NEGATIVE 09/10/2021 1458   LEUKOCYTESUR NEGATIVE 09/10/2021 1458   Dr. Sedalia Muta [facility]  If 7PM-7AM, please contact overnight-coverage provider If 7AM-7PM, please contact day coverage provider [email or web address]  09/11/2021, 1:03 AM

## 2021-09-11 NOTE — Assessment & Plan Note (Signed)
-   Baseline unknown ?- Serum creatinine on presentation is 4.33/GFR 30 ?

## 2021-09-11 NOTE — Assessment & Plan Note (Signed)
-   In the emergency department ?- Neurologically intact, low clinical suspicion for intracranial hemorrhage ?- I discussed this with daughter, Ms. [name] and I have offered a CT of the head to evaluate for possible bleed and Ms. [name] has declined stating that she just wants him to be comfortable when left alone is much as possible ?

## 2021-09-11 NOTE — TOC Transition Note (Signed)
Transition of Care (TOC) - CM/SW Discharge Note ? ? ?Patient Details  ?Name: Gabriel Santiago ?MRN: [id number] ?Date of Birth: 05/23/1940 ? ?Transition of Care (TOC) CM/SW Contact:  ?Shelbie Hutching, RN ?Phone Number: ?09/11/2021, 12:53 PM ? ? ?Clinical Narrative:    ?Patient has been approved for [facility] home, they have bed availability today.  Lorayne Bender with [facility] will get family to go and sign consents and she will set up EMS transport for 5 pm.   ? ? ?Final next level of care: [place] ?Barriers to Discharge: Barriers Resolved ? ? ?Patient Goals and CMS Choice ?Patient states their goals for this hospitalization and ongoing recovery are:: family wants comfort care and hospice ?CMS Medicare.gov Compare Post Acute Care list provided to:: Patient Represenative (must comment) ?Choice offered to / list presented to : Adult Children ? ?Discharge Placement ?  ?           ?  ?  ?  ?  ? ?Discharge Plan and Services ?  ?Discharge Planning Services: CM Consult ?Post Acute Care Choice: Hospice          ?DME Arranged: N/A ?DME Agency: NA ?  ?  ?  ?HH Arranged: NA ?  ?  ?  ?  ? ?Social Determinants of Health (SDOH) Interventions ?  ? ? ?Readmission Risk Interventions ?   ? View : No data to display.  ?  ?  ?  ? ? ? ? ? ?

## 2021-09-11 NOTE — ED Notes (Signed)
Patient resting in bed with eyes closed. Resp even, unlabored on RA. Appears sleeping. No distress noted. Bed in low position with call light in place, side rails up and bed alarm on. ?

## 2021-09-11 NOTE — ED Notes (Signed)
New sacral pad placed on patient and skin tear wrapped per patient's request. Family at bedside. No needs expressed to RN. Bed lowered and locked. ?

## 2021-09-11 NOTE — Hospital Course (Addendum)
Mr. Sukhman Martine is a CKD 4, failure to thrive, urinary retention, insomnia, who presents emergency department for chief concerns of failure to thrive. ? ?Per ED report, Mr. Honea was living at home by himself until 1 month ago.  In 1 month time, he lost his appetite and his weight down to skin and bones.  He became increasingly weak and refused ED visit.  Ultimately he consented and was brought to [facility] he had acute kidney injury from urinary retention.  His serum creatinine improved after Foley was placed at [facility] and then he was discharged to SNF facility.  At the SNF, he ripped out his Foley about a week ago and refused to come to the ED. ? ?Ultimately he consented to come to the ED and in the emergency department his daughter, Lia Hopping refused labs and further treatment stating that she would like her father to be comfortable. ? ?Patient was noted to have bleeding from the meatus and Foley was attempted however unsuccessful in the emergency department. ?Urology was consulted, patient refused Foley placement with urology.  Both urologist and ED provider discussed with patient at bedside that if he did not get a Foley he will develop kidney failure and he continued to refuse. ? ?At this time family had elected to have patient be on comfort care. ? ?ED treatment: Lidocaine jelly application, ceftriaxone 2 g IV one-time dose, sodium chloride 1 L bolus.  Ceftriaxone and sodium chloride 1 L bolus were not given at the request of daughter. ? ? ?I assumed care of patient this AM, 5/9.   ?Patient has been accepted at residential hospice facility and is medically stable for discharge and transport there today. ?

## 2021-10-04 DEATH — deceased
